# Patient Record
Sex: Male | Born: 1975 | Race: Black or African American | Hispanic: No | Smoking: Never smoker
Health system: Southern US, Community
[De-identification: ages and names within clinical notes are randomized; demographics above are authoritative.]

---

## 2012-08-31 ENCOUNTER — Emergency Department: Payer: Self-pay | Admitting: Emergency Medicine

## 2015-01-07 ENCOUNTER — Emergency Department: Payer: Self-pay

## 2015-01-07 ENCOUNTER — Emergency Department
Admission: EM | Admit: 2015-01-07 | Discharge: 2015-01-07 | Disposition: A | Discharge status: Home / Self Care | Payer: Self-pay | Attending: Emergency Medicine | Admitting: Emergency Medicine

## 2015-01-07 ENCOUNTER — Encounter: Payer: Self-pay | Admitting: Emergency Medicine

## 2015-01-07 DIAGNOSIS — R609 Edema, unspecified: Secondary | ICD-10-CM

## 2015-01-07 DIAGNOSIS — S7012XA Contusion of left thigh, initial encounter: Secondary | ICD-10-CM | POA: Insufficient documentation

## 2015-01-07 DIAGNOSIS — S79922A Unspecified injury of left thigh, initial encounter: Secondary | ICD-10-CM | POA: Insufficient documentation

## 2015-01-07 DIAGNOSIS — Y998 Other external cause status: Secondary | ICD-10-CM | POA: Insufficient documentation

## 2015-01-07 DIAGNOSIS — S76302A Unspecified injury of muscle, fascia and tendon of the posterior muscle group at thigh level, left thigh, initial encounter: Secondary | ICD-10-CM

## 2015-01-07 DIAGNOSIS — R52 Pain, unspecified: Secondary | ICD-10-CM

## 2015-01-07 DIAGNOSIS — Y9302 Activity, running: Secondary | ICD-10-CM | POA: Insufficient documentation

## 2015-01-07 DIAGNOSIS — I10 Essential (primary) hypertension: Secondary | ICD-10-CM | POA: Insufficient documentation

## 2015-01-07 DIAGNOSIS — Y9289 Other specified places as the place of occurrence of the external cause: Secondary | ICD-10-CM | POA: Insufficient documentation

## 2015-01-07 DIAGNOSIS — X58XXXA Exposure to other specified factors, initial encounter: Secondary | ICD-10-CM | POA: Insufficient documentation

## 2015-01-07 MED ORDER — HYDROCHLOROTHIAZIDE 25 MG PO TABS: 25.0000 mg | ORAL_TABLET | Freq: Every day | ORAL | Status: AC

## 2015-01-07 MED ORDER — OXYCODONE-ACETAMINOPHEN 5-325 MG PO TABS: 1.0000 | ORAL_TABLET | Freq: Four times a day (QID) | ORAL | Status: AC | PRN

## 2015-01-07 MED ORDER — OXYCODONE-ACETAMINOPHEN 5-325 MG PO TABS
ORAL_TABLET | ORAL | Status: AC
  Filled 2015-01-07: qty 2

## 2015-01-07 MED ORDER — OXYCODONE-ACETAMINOPHEN 5-325 MG PO TABS
2.0000 | ORAL_TABLET | Freq: Once | ORAL | Status: AC
  Administered 2015-01-07: 2 via ORAL

## 2015-01-07 NOTE — ED Notes (Signed)
Pt states he was racing and felt a pop in the back of his left leg and pain, pt states he has been using ice and heat, briusing noted to the back of his leg, pt ambulatory to room, pt states he has been told he has boarderline HTN but not on medication

## 2015-01-07 NOTE — ED Provider Notes (Signed)
[facility] Emergency Department Provider Note     Time seen: ----------------------------------------- 6:11 PM on 01/07/2015 -----------------------------------------    I have reviewed the triage vital signs and the nursing notes.   HISTORY  Chief Complaint Leg Pain    HPI Ralph Carter is a 39 y.o. male who presents ER after he felt a popping sensation in the back of his left leg and has had significant pain and swelling mostly in his left calf since that period time. Patient states been using ice and heat and bruising was noted to the back of his left leg. Patient is ambulatory, states he has borderline high blood pressure but is no medication. He was running a couple days ago when the event happened.   History reviewed. No pertinent past medical history.  There are no active problems to display for this patient.   History reviewed. No pertinent past surgical history.  Allergies Review of patient's allergies indicates no known allergies.  Social History Social History  Substance Use Topics  . Smoking status: Never Smoker   . Smokeless tobacco: None  . Alcohol Use: No    Review of Systems Constitutional: Negative for fever. Musculoskeletal: Positive for left leg pain Skin: Positive for bruising   ____________________________________________   PHYSICAL EXAM:  VITAL SIGNS: ED Triage Vitals  Enc Vitals Group     BP 01/07/15 1756 196/120 mmHg     Pulse Rate 01/07/15 1756 69     Resp 01/07/15 1756 20     Temp 01/07/15 1756 98.7 F (37.1 C)     Temp Source 01/07/15 1756 Oral     SpO2 01/07/15 1756 98 %     Weight 01/07/15 1756 300 lb (136.079 kg)     Height 01/07/15 1756  (1.803 m)     Head Cir --      Peak Flow --      Pain Score 01/07/15 1757 6     Pain Loc --      Pain Edu? --      Excl. in GC? --     Constitutional: Alert and oriented. Well appearing and in no distress. Eyes: Conjunctivae are normal.   Musculoskeletal: Marked edema of the left leg diffusely. The right. Along the left hamstring there is significant ecchymosis that extends down to the popliteal fossa Neurologic:  Normal speech and language. Skin:  Significant ecchymosis is noted along the left hamstring muscle group.  ____________________________________________  ED COURSE:  Pertinent labs & imaging results that were available during my care of the patient were reviewed by me and considered in my medical decision making (see chart for details). We'll obtain ultrasound. Likely hamstring injury with edema secondary to same  RADIOLOGY  Left lower extremity ultrasound IMPRESSION: No evidence of deep venous thrombosis. ____________________________________________  FINAL ASSESSMENT AND PLAN  Edema, hamstring injury  Plan: Patient with as dictated above. Patient with hamstring muscle group injury. Likely semitendinosus are semimembranosus muscle and tendon injury. He is able to able at this point, advised swelling is likely secondary to the injury, he is stable for outpatient follow-up. We'll advise close follow-up for his blood pressure rechecked, will start him on blood pressure medications as well.   Emily Filbert, MD   Emily Filbert, MD 01/07/15 [phone]

## 2015-01-07 NOTE — Discharge Instructions (Signed)
Hypertension Hypertension is another name for high blood pressure. High blood pressure forces your heart to work harder to pump blood. A blood pressure reading has two numbers, which includes a higher number over a lower number (example: 110/72). HOME CARE   Have your blood pressure rechecked by your doctor.  Only take medicine as told by your doctor. Follow the directions carefully. The medicine does not work as well if you skip doses. Skipping doses also puts you at risk for problems.  Do not smoke.  Monitor your blood pressure at home as told by your doctor. GET HELP IF:  You think you are having a reaction to the medicine you are taking.  You have repeat headaches or feel dizzy.  You have puffiness (swelling) in your ankles.  You have trouble with your vision. GET HELP RIGHT AWAY IF:   You get a very bad headache and are confused.  You feel weak, numb, or faint.  You get chest or belly (abdominal) pain.  You throw up (vomit).  You cannot breathe very well. MAKE SURE YOU:   Understand these instructions.  Will watch your condition.  Will get help right away if you are not doing well or get worse. Document Released: 10/25/2007 Document Revised: 05/13/2013 Document Reviewed: 02/28/2013 [facility] Patient Information 2015 [place], [place]. This information is not intended to replace advice given to you by your health care provider. Make sure you discuss any questions you have with your health care provider. Hamstring Strain  Hamstrings are the large muscles in the back of the thighs. A strain or tear injury happens when there is a sudden stretch or pull on these muscles and tendons. Tendons are cord like structures that attach muscle to bone. These injuries are commonly seen in activities such as sprinting due to sudden acceleration.  DIAGNOSIS  Often the diagnosis can be made by examination. HOME CARE INSTRUCTIONS   Apply ice to the sore area for 15-46minutes, 03-04 times  per day. Do this while awake for the first 2 days. Put the ice in a plastic bag, and place a towel between the bag of ice and your skin.  Keep your knee flexed when possible. This means your foot is held off the ground slightly if you are on crutches. When lying down, a pillow under the knee will take strain off the muscles and provide some relief.  If a compression bandage such as an ace wrap was applied, use it until you are seen again. You may remove it for sleeping, showers and baths. If the wrap seems to be too tight and is uncomfortable, wrap it more loosely. If your toes or foot are getting cold or blue, it is too tight.  Walk or move around as the pain allows, or as instructed. Resume full activities as suggested by your caregiver. This is often safest when the strength of the injured leg has nearly returned to normal.  Only take over-the-counter or prescription medicines for pain, discomfort, or fever as directed by your caregiver. SEEK MEDICAL CARE IF:   You have an increase in bruising, swelling or pain.  You notice coldness or blueness of your toes or foot.  Pain relief is not obtained with medications.  You have increasing pain in the area and seem to be getting worse rather than better.  You notice your thigh getting larger in size (this could indicate bleeding into the muscle). Document Released: 01/31/2001 Document Revised: 07/31/2011 Document Reviewed: 05/10/2008 [facility] Patient Information 2015 [place], [place].  This information is not intended to replace advice given to you by your health care provider. Make sure you discuss any questions you have with your health care provider. ° °

## 2015-01-07 NOTE — ED Notes (Signed)
Patient to ED with c/o left leg pain, reports running a few days ago and felt a pop, now with continued pain, swelling and bruising to back of thigh.

## 2015-09-24 IMAGING — US US EXTREM LOW VENOUS*L*
1 series · 13 of 24 positions shown · non-contrast
Comparison: None.

CLINICAL DATA: Acute onset of left leg swelling for 1 week. Heard
pop while running. Initial encounter.



[Series 1: us extrem low venous*left* · 0.08mm/px · 13 of 29 slices shown]
[im 1/29]
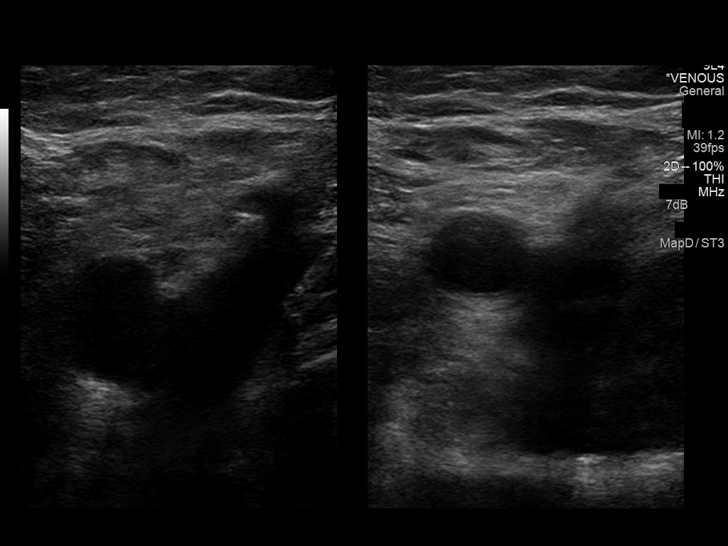
[im 3/29]
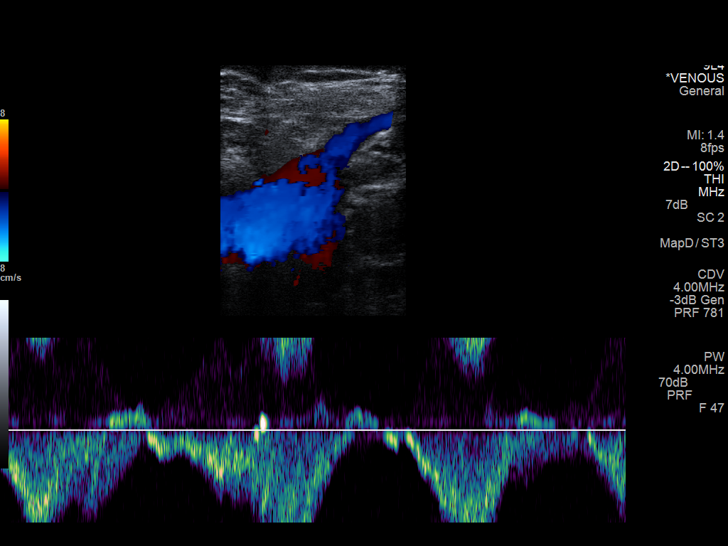
[im 5/29]
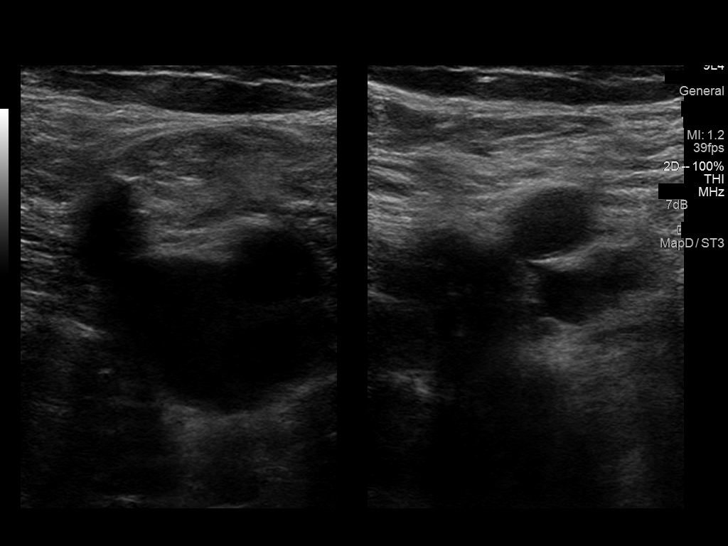
[im 8/29]
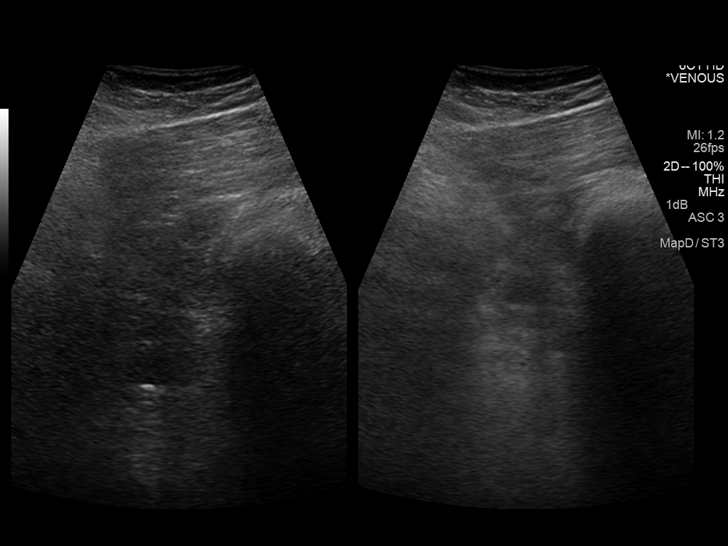
[im 10/29]
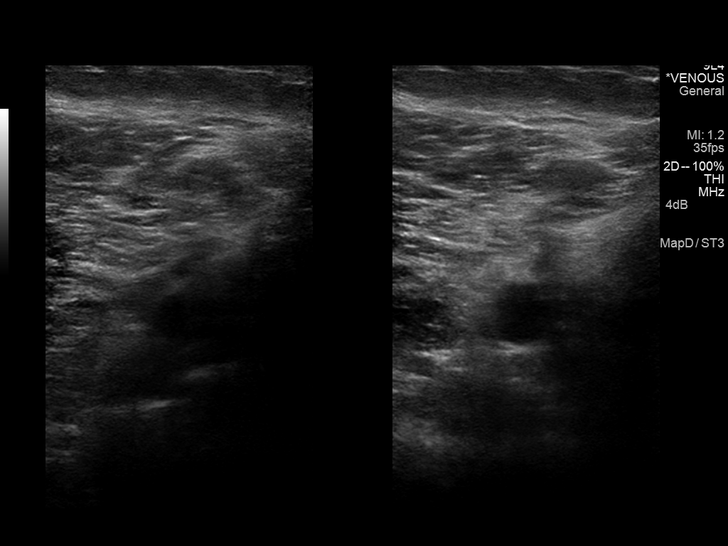
[im 13/29]
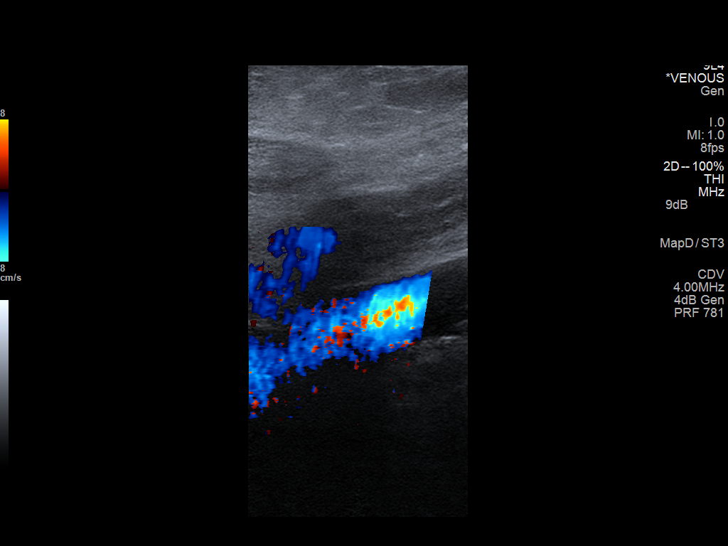
[im 15/29]
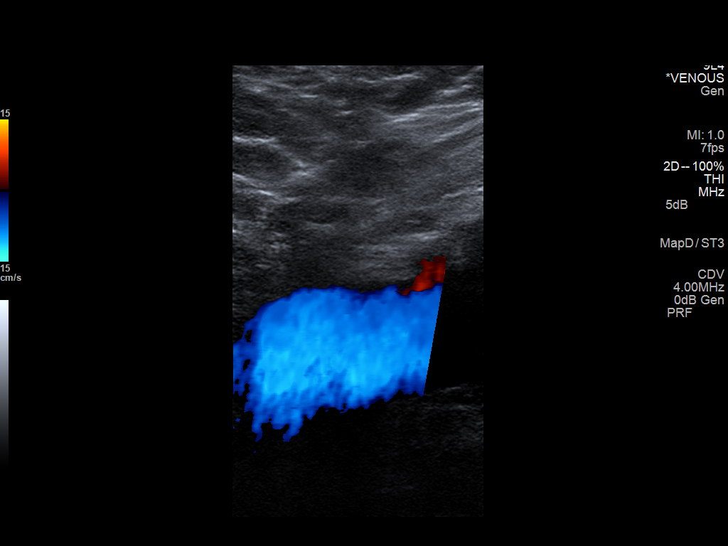
[im 16/29]
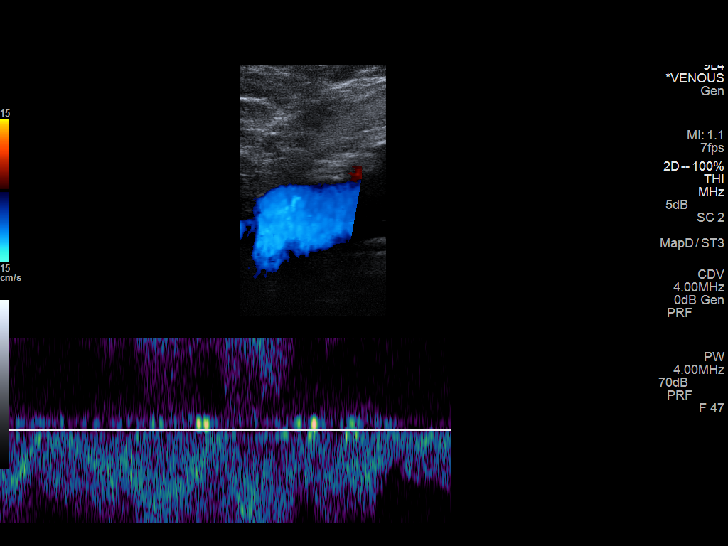
[im 19/29]
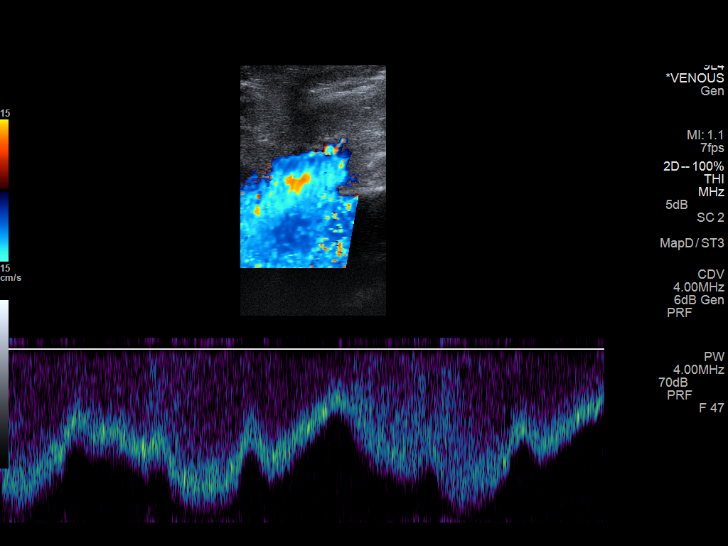
[im 21/29]
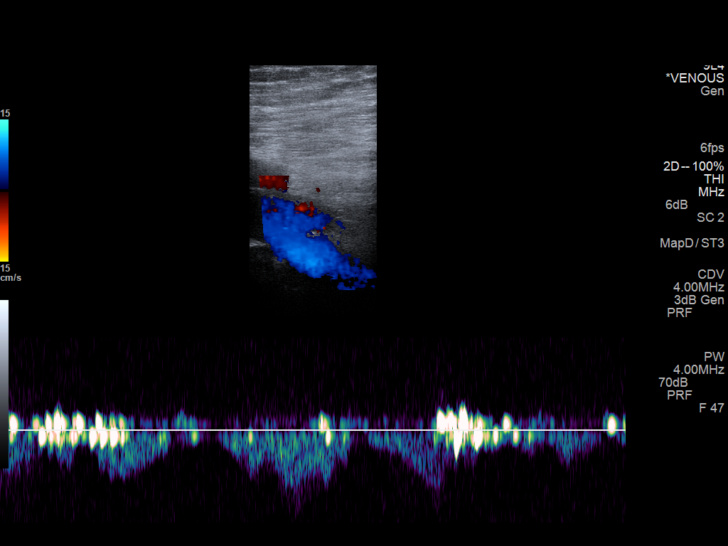
[im 24/29]
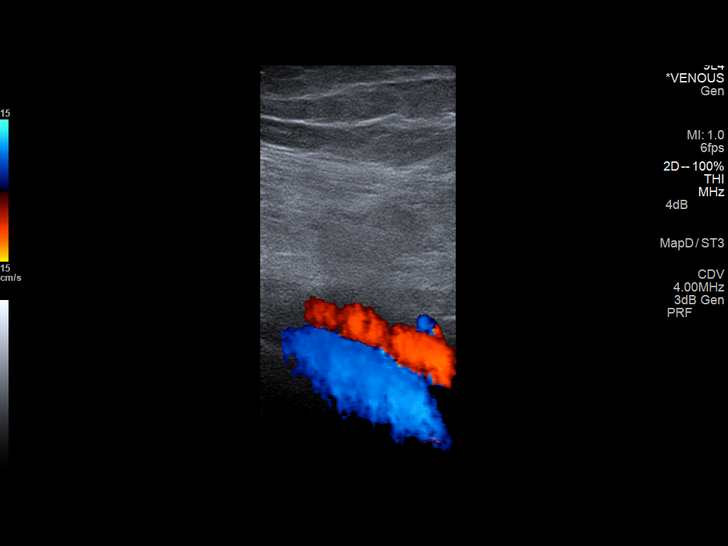
[im 26/29]
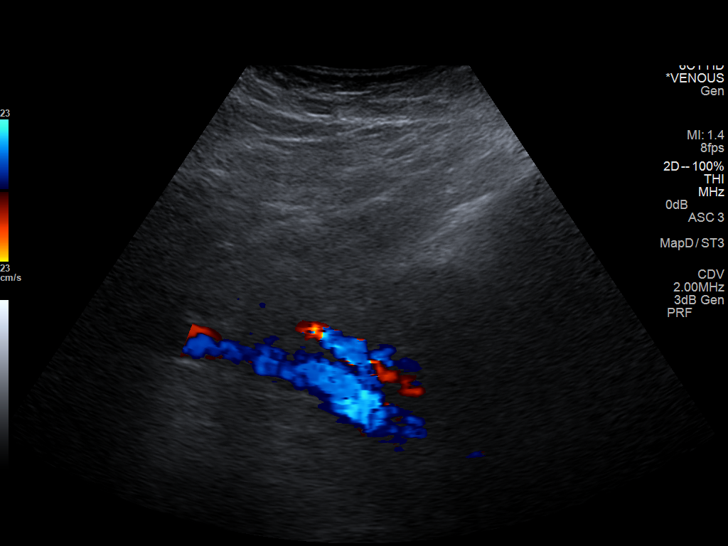
[im 29/29]
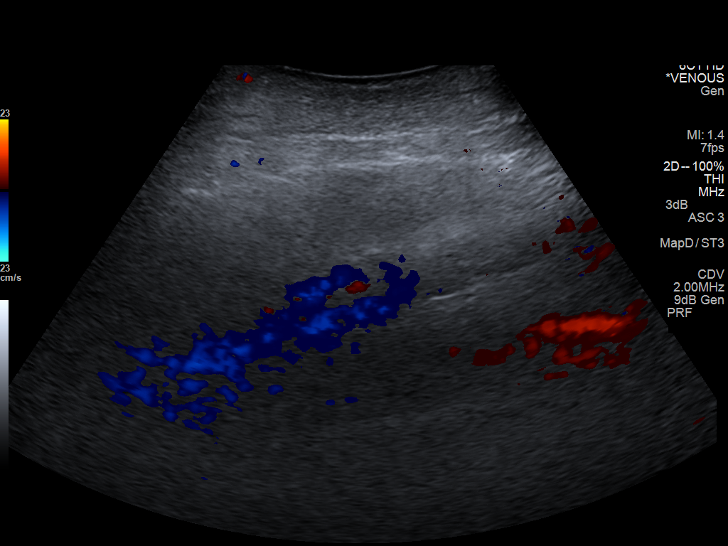

[13 of 24 positions shown; findings below may reference images not displayed]

FINDINGS: Contralateral Common Femoral Vein: Respiratory phasicity is normal
and symmetric with the symptomatic side. No evidence of thrombus.
Normal compressibility.

Common Femoral Vein: No evidence of thrombus. Normal
compressibility, respiratory phasicity and response to augmentation.

Saphenofemoral Junction: No evidence of thrombus. Normal
compressibility and flow on color Doppler imaging.

Profunda Femoral Vein: No evidence of thrombus. Normal
compressibility and flow on color Doppler imaging.

Femoral Vein: No evidence of thrombus. Normal compressibility,
respiratory phasicity and response to augmentation.

Popliteal Vein: No evidence of thrombus. Normal compressibility,
respiratory phasicity and response to augmentation.

Calf Veins: No evidence of thrombus. Normal compressibility and flow
on color Doppler imaging.

Superficial Great Saphenous Vein: No evidence of thrombus. Normal
compressibility and flow on color Doppler imaging.

Venous Reflux:  None.

Other Findings:  None.
IMPRESSION: No evidence of deep venous thrombosis.
